# Patient Record
Sex: Male | Born: 1995 | Hispanic: Yes | Marital: Single | Smoking: Never smoker
Health system: Southern US, Community
[De-identification: ages and names within clinical notes are randomized; demographics above are authoritative.]

---
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
## 2016-08-09 ENCOUNTER — Emergency Department
Admission: EM | Admit: 2016-08-09 | Discharge: 2016-08-09 | Disposition: A | Discharge status: Home / Self Care | Payer: Worker's Compensation | Attending: Emergency Medicine | Admitting: Emergency Medicine

## 2016-08-09 DIAGNOSIS — Y929 Unspecified place or not applicable: Secondary | ICD-10-CM | POA: Diagnosis not present

## 2016-08-09 DIAGNOSIS — Y990 Civilian activity done for income or pay: Secondary | ICD-10-CM | POA: Diagnosis not present

## 2016-08-09 DIAGNOSIS — S61211A Laceration without foreign body of left index finger without damage to nail, initial encounter: Secondary | ICD-10-CM | POA: Diagnosis not present

## 2016-08-09 DIAGNOSIS — W458XXA Other foreign body or object entering through skin, initial encounter: Secondary | ICD-10-CM | POA: Insufficient documentation

## 2016-08-09 DIAGNOSIS — Y939 Activity, unspecified: Secondary | ICD-10-CM | POA: Diagnosis not present

## 2016-08-09 DIAGNOSIS — S60941A Unspecified superficial injury of left index finger, initial encounter: Secondary | ICD-10-CM | POA: Diagnosis present

## 2016-08-09 MED ORDER — LIDOCAINE HCL (PF) 1 % IJ SOLN
5.0000 mL | Freq: Once | INTRAMUSCULAR | Status: AC
  Administered 2016-08-09: 5 mL
  Filled 2016-08-09: qty 5

## 2016-08-09 NOTE — ED Provider Notes (Signed)
[facility] Emergency Department Provider Note   ____________________________________________   I have reviewed the triage vital signs and the nursing notes.   HISTORY  Chief Complaint Laceration (left index finger)    HPI Casey Stout is a 21 y.o. male presents to the emergency department with laceration along the dorsal aspect of the left index finger he sustained from a hook in his workplace earlier this evening. Patient reports maintaining hemorrhage control and nearly following the injury, as well as, intact movement and sensation of the index finger since the injury. Patient denies injury to any other part of the left hand. Patient denies fever, chills, headache, vision changes, chest pain, chest tightness, shortness of breath, abdominal pain, nausea and vomiting.  History reviewed. No pertinent past medical history.  There are no active problems to display for this patient.   Past Surgical History:  Procedure Laterality Date  . KNEE SURGERY Left 2013    Prior to Admission medications   Not on File    Allergies Penicillins  No family history on file.  Social History Social History  Substance Use Topics  . Smoking status: Never Smoker  . Smokeless tobacco: Never Used  . Alcohol use No    Review of Systems Constitutional: Negative for fever/chills Eyes: No visual changes. ENT:  Negative for sore throat and for difficulty swallowing Cardiovascular: Denies chest pain. Respiratory: Denies cough. Denies shortness of breath. Gastrointestinal: No abdominal pain.  No nausea, vomiting, diarrhea. Genitourinary: Negative for dysuria. Musculoskeletal: Negative for back pain. Skin: Negative for rash. Laceration to the left index finger along the dorsal aspect. Hemorrhage control. Neurological: Negative for headaches.  Negative focal weakness or numbness. Negative for loss of consciousness. Able to  ambulate. ____________________________________________   PHYSICAL EXAM:  VITAL SIGNS: ED Triage Vitals [08/09/16 1752]  Enc Vitals Group     BP (!) 157/75     Pulse Rate 65     Resp 18     Temp 97.8 F (36.6 C)     Temp Source Oral     SpO2 96 %     Weight 285 lb (129.3 kg)     Height 6\' 1"  (1.854 m)     Head Circumference      Peak Flow      Pain Score 5     Pain Loc      Pain Edu?      Excl. in GC?     Constitutional: Alert and oriented. Well appearing and in no acute distress.   Eyes: Conjunctivae are normal. PERRL. EOMI  Head: Normocephalic and atraumatic. ENT:      Ears: Canals clear. TMs intact bilaterally.      Nose: No congestion/rhinnorhea.      Mouth/Throat: Mucous membranes are moist. Neck:Supple. No thyromegaly. No stridor.  Cardiovascular: Normal rate, regular rhythm. Normal S1 and S2.  Good peripheral circulation. Respiratory: Normal respiratory effort without tachypnea or retractions. Lungs CTAB. Good air entry to the bases with no decreased or absent breath sounds. Hematological/Lymphatic/Immunological: No cervical lymphadenopathy. Cardiovascular: Normal rate, regular rhythm. Normal distal pulses. Respiratory: Normal respiratory effort. No wheezes/rales/rhonchi. Lungs CTAB with no W/R/R. Gastrointestinal: Bowel sounds 4 quadrants. Soft and nontender to palpation. No guarding or rigidity. No palpable masses. No distention. No CVA tenderness. Musculoskeletal: Left index finger with laceration along the dorsal aspect without extender tendon or nailbed involvement. Left index finger with range of motion, strength and sensation intact. Nontender with normal range of motion in all extremities.  Neurologic: Normal speech and language. No gross focal neurologic deficits are appreciated. No gait instability. Cranial nerves: II-X intact. No sensory loss or abnormal reflexes.  Skin:  Skin is warm, dry and intact. No rash noted. Laceration along the left index finger,  2.25 cm full thickness skin laceration. Psychiatric: Mood and affect are normal. Speech and behavior are normal. Patient exhibits appropriate insight and judgement.  ____________________________________________   LABS (all labs ordered are listed, but only abnormal results are displayed)  Labs Reviewed - No data to display ____________________________________________  EKG None  ____________________________________________  RADIOLOGY none ____________________________________________   PROCEDURES  Procedure(s) performed:  LACERATION REPAIR Performed by: Clois Comberraci M Novi Calia Authorized by: Clois Comberraci M Kasondra Junod Consent: Verbal consent obtained. Risks and benefits: risks, benefits and alternatives were discussed Consent given by: patient Patient identity confirmed: provided demographic data Prepped and Draped in normal sterile fashion Wound explored  Laceration Location: Dorsal aspect of the left index finger  Laceration Length: 2.25 cm  No Foreign Bodies seen or palpated  Anesthesia: local infiltration  Local anesthetic: lidocaine 1%   Anesthetic total: 7.0 ml  Irrigation method: syringe Amount of cleaning: standard  Skin closure: Ethilon 5-0   Number of sutures: 7   Technique: Simple interrupted   Patient tolerance: Patient tolerated the procedure well with no immediate complications.   Critical Care performed: no ____________________________________________   INITIAL IMPRESSION / ASSESSMENT AND PLAN / ED COURSE  Pertinent labs & imaging results that were available during my care of the patient were reviewed by me and considered in my medical decision making (see chart for details).  Patient presents to emergency department with laceration to the left index finger, dorsal aspect without extensor tendon or nailbed involvement.. History,physical exam findings and vital signs are reassuring. Assessment confirmed movement and sensation of the digit before and after  wound closure. Laceration required suture closure as noted above. Patient tolerated procedure well. Pt instructed to keep wound clean and dry and will return to the emergency department or PCP for suture removal in 7 days. Patient also instructed to watch for signs of infection and return if changes are noted.       ____________________________________________   FINAL CLINICAL IMPRESSION(S) / ED DIAGNOSES  Final diagnoses:  Laceration of left index finger without foreign body without damage to nail, initial encounter       NEW MEDICATIONS STARTED DURING THIS VISIT:  There are no discharge medications for this patient.    Note:  This document was prepared using Dragon voice recognition software and may include unintentional dictation errors.    Percell BostonLittle, Kalee Broxton M, PA-C 08/09/16 2154    Jeanmarie PlantMcShane, James A, MD 08/09/16 2201

## 2016-08-09 NOTE — ED Notes (Signed)

## 2016-08-09 NOTE — ED Triage Notes (Signed)
Pt presented to the ED with a left index finger laceration. Accidentally cut with a knife while at work. Sensation and mobility still intact.

## 2016-08-09 NOTE — Discharge Instructions (Signed)
Return to your primary care provider or the emergency department for suture removal in 7 days. Monitor the wound for any signs of developing infection, if he noted any return to your primary care or emergency department to seek care.

## 2016-08-17 ENCOUNTER — Ambulatory Visit
Admission: EM | Admit: 2016-08-17 | Discharge: 2016-08-17 | Disposition: A | Discharge status: Home / Self Care | Payer: Worker's Compensation

## 2016-08-17 DIAGNOSIS — Z4802 Encounter for removal of sutures: Secondary | ICD-10-CM | POA: Diagnosis not present

## 2016-08-17 NOTE — ED Triage Notes (Signed)
Suture removal, site healing well.

## 2018-09-27 ENCOUNTER — Emergency Department: Payer: BC Managed Care – PPO

## 2018-09-27 ENCOUNTER — Emergency Department
Admission: EM | Admit: 2018-09-27 | Discharge: 2018-09-27 | Disposition: A | Discharge status: Home / Self Care | Payer: BC Managed Care – PPO | Attending: Emergency Medicine | Admitting: Emergency Medicine

## 2018-09-27 ENCOUNTER — Encounter: Payer: Self-pay | Admitting: Emergency Medicine

## 2018-09-27 ENCOUNTER — Other Ambulatory Visit: Payer: Self-pay

## 2018-09-27 DIAGNOSIS — U071 COVID-19: Secondary | ICD-10-CM | POA: Insufficient documentation

## 2018-09-27 DIAGNOSIS — Z20822 Contact with and (suspected) exposure to covid-19: Secondary | ICD-10-CM

## 2018-09-27 DIAGNOSIS — R05 Cough: Secondary | ICD-10-CM | POA: Diagnosis present

## 2018-09-27 NOTE — ED Triage Notes (Signed)
Cough and headache began yesterday.

## 2018-09-27 NOTE — Discharge Instructions (Signed)
Follow-up with your regular doctor if not improving in 3 to 4 days.  Return emergency department if worsening.  Covid test will be resulted in 6 to 48 hours.  You should remain out of work and quarantined until your test results are received.  If they are positive you should remain out of work for an additional 10 days.

## 2018-09-27 NOTE — ED Notes (Signed)
Pt st HA, dry cough, fever, started yesterday. Pt denies Cp/SHOB.

## 2018-09-27 NOTE — ED Provider Notes (Signed)
[facility] Emergency Department Provider Note  ____________________________________________   First MD Initiated Contact with Patient 09/27/18 1738     (approximate)  I have reviewed the triage vital signs and the nursing notes.   HISTORY  Chief Complaint Cough and Headache    HPI Casey PottsLuis Munoz Stout is a 23 y.o. male presents the ED with COVID type symptoms.  Patient states he has had a headache, fever, chills vomiting and cough.  States he lost his sense of smell.  He states he has had a mild sore throat but not bad.  He denies any diarrhea.  No known exposure to COVID.  No chest pain or shortness of breath.   History reviewed. No pertinent past medical history.  There are no active problems to display for this patient.   Past Surgical History:  Procedure Laterality Date  . KNEE SURGERY Left 2013    Prior to Admission medications   Not on File    Allergies Penicillins  No family history on file.  Social History Social History   Tobacco Use  . Smoking status: Never Smoker  . Smokeless tobacco: Never Used  Substance Use Topics  . Alcohol use: No  . Drug use: No    Review of Systems  Constitutional: Positive fever/chills Eyes: No visual changes. ENT: Mild sore throat.  Loss of sense of smell Respiratory: Positive cough Genitourinary: Negative for dysuria. Musculoskeletal: Negative for back pain. Skin: Negative for rash.    ____________________________________________   PHYSICAL EXAM:  VITAL SIGNS: ED Triage Vitals  Enc Vitals Group     BP 09/27/18 1722 (!) 146/74     Pulse Rate 09/27/18 1722 76     Resp 09/27/18 1722 20     Temp 09/27/18 1722 99.8 F (37.7 C)     Temp Source 09/27/18 1722 Oral     SpO2 09/27/18 1722 99 %     Weight 09/27/18 1723 280 lb (127 kg)     Height 09/27/18 1723 6' (1.829 m)     Head Circumference --      Peak Flow --      Pain Score 09/27/18 1723 5     Pain Loc --      Pain Edu? --      Excl. in GC? --     Constitutional: Alert and oriented. Well appearing and in no acute distress. Eyes: Conjunctivae are normal.  Head: Atraumatic. Nose: No congestion/rhinnorhea. Mouth/Throat: Mucous membranes are moist.  Throat is minimally red Neck:  supple no lymphadenopathy noted Cardiovascular: Normal rate, regular rhythm. Heart sounds are normal Respiratory: Normal respiratory effort.  No retractions, lungs c t a  Abd: soft nontender bs normal all 4 quad GU: deferred Musculoskeletal: FROM all extremities, warm and well perfused Neurologic:  Normal speech and language.  Skin:  Skin is warm, dry and intact. No rash noted. Psychiatric: Mood and affect are normal. Speech and behavior are normal.  ____________________________________________   LABS (all labs ordered are listed, but only abnormal results are displayed)  Labs Reviewed  SARS CORONAVIRUS 2 (TAT 6-24 HRS)   ____________________________________________   ____________________________________________  RADIOLOGY  Chest x-ray is normal  ____________________________________________   PROCEDURES  Procedure(s) performed: No  Procedures    ____________________________________________   INITIAL IMPRESSION / ASSESSMENT AND PLAN / ED COURSE  Pertinent labs & imaging results that were available during my care of the patient were reviewed by me and considered in my medical decision making (see chart for details).  Patient is 23 year old male presents emergency department COVID symptoms.  See HPI  Physical exam shows patient to appear well.  Vitals are stable.  Chest x-ray Covid test   Chest x-ray is normal, COVID test is pending  Explained findings to the patient.  He was given a work note and told to quarantine until his COVID test is resulted.  He states he understands and will comply.  He is to return emergency department worsening.  He was discharged in stable condition with a work note stating he  had pending flu test and could return once it was negative or if it is positive he should remain out additional 10 days.  Casey Stout was evaluated in Emergency Department on 09/27/2018 for the symptoms described in the history of present illness. He was evaluated in the context of the global COVID-19 pandemic, which necessitated consideration that the patient might be at risk for infection with the SARS-CoV-2 virus that causes COVID-19. Institutional protocols and algorithms that pertain to the evaluation of patients at risk for COVID-19 are in a state of rapid change based on information released by regulatory bodies including the CDC and federal and state organizations. These policies and algorithms were followed during the patient's care in the ED.   As part of my medical decision making, I reviewed the following data within the [place] notes reviewed and incorporated, Old chart reviewed, Radiograph reviewed chest x-ray is normal, Notes from prior ED visits and [place] Controlled Substance Database  ____________________________________________   FINAL CLINICAL IMPRESSION(S) / ED DIAGNOSES  Final diagnoses:  Suspected Covid-19 Virus Infection      NEW MEDICATIONS STARTED DURING THIS VISIT:  There are no discharge medications for this patient.    Note:  This document was prepared using Dragon voice recognition software and may include unintentional dictation errors.    Versie Starks, PA-C 09/27/18 1931    Earleen Newport, MD 09/27/18 2249

## 2018-09-28 LAB — SARS CORONAVIRUS 2 (TAT 6-24 HRS): SARS Coronavirus 2: POSITIVE — AB

## 2018-09-29 ENCOUNTER — Telehealth: Payer: Self-pay | Admitting: Emergency Medicine

## 2018-09-29 NOTE — Telephone Encounter (Signed)
Called patient and informed him of postive covid 19 result.  Explained cdc guidelines for coming out of quarantine.

## 2018-10-08 ENCOUNTER — Other Ambulatory Visit: Payer: Self-pay

## 2018-10-08 DIAGNOSIS — Z20822 Contact with and (suspected) exposure to covid-19: Secondary | ICD-10-CM

## 2018-10-09 LAB — NOVEL CORONAVIRUS, NAA: SARS-CoV-2, NAA: NOT DETECTED

## 2020-03-18 IMAGING — DX DG CHEST 1V PORT
1 series · 1 of 1 positions shown · non-contrast
Comparison: None.

CLINICAL DATA: Suspected TPOH6-G6, cough, fever

EXAM:
PORTABLE CHEST 1 VIEW

[chest ap]
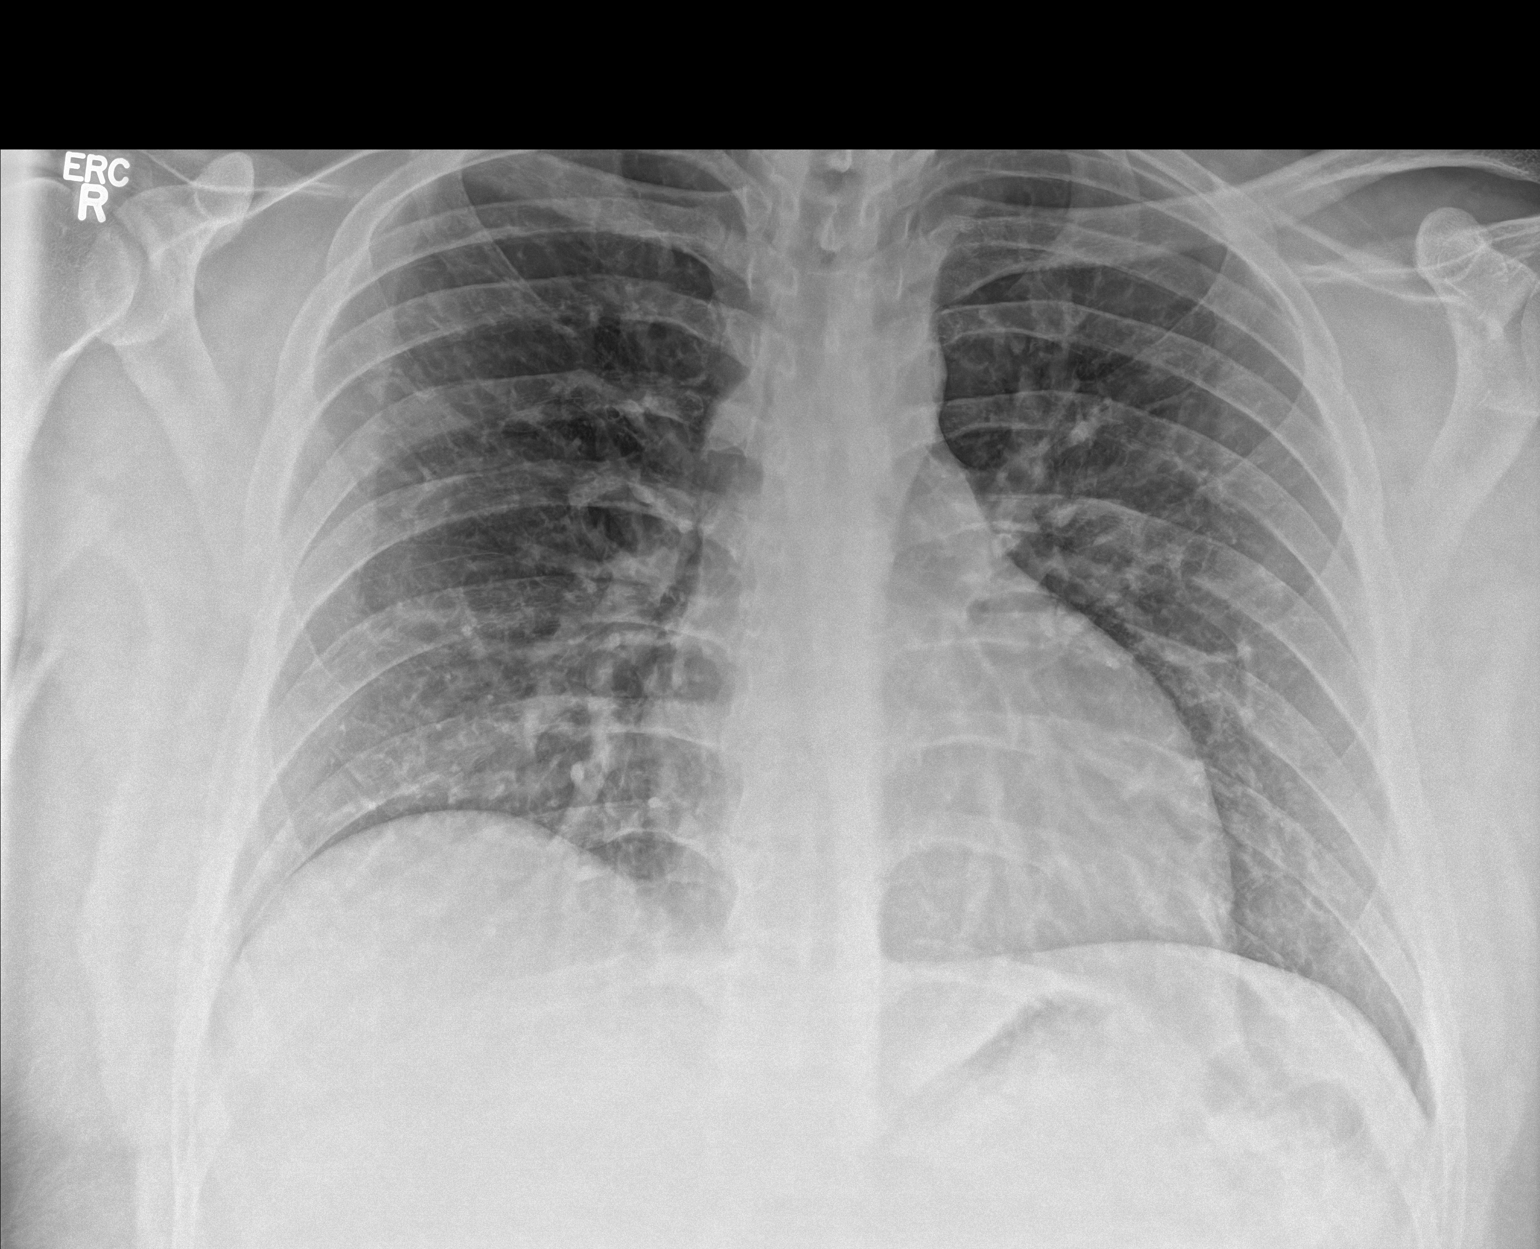

[1 of 1 positions shown; findings below may reference images not displayed]

FINDINGS: The heart size and mediastinal contours are within normal limits.
Both lungs are clear. The visualized skeletal structures are
unremarkable.
IMPRESSION: No acute abnormality of the lungs in AP portable projection.
